# Patient Record
Sex: Female | Born: 1980 | Race: White | Hispanic: No | Marital: Married | Smoking: Never smoker
Health system: Southern US, Community
[De-identification: ages and names within clinical notes are randomized; demographics above are authoritative.]

## PROBLEM LIST (undated history)

## (undated) DIAGNOSIS — J45909 Unspecified asthma, uncomplicated: Secondary | ICD-10-CM

## (undated) HISTORY — PX: NO PAST SURGERIES: SHX2092

---

## 2015-08-05 ENCOUNTER — Other Ambulatory Visit (HOSPITAL_COMMUNITY): Payer: Self-pay | Admitting: Obstetrics and Gynecology

## 2015-08-05 DIAGNOSIS — Z3689 Encounter for other specified antenatal screening: Secondary | ICD-10-CM

## 2015-08-05 DIAGNOSIS — Z3A18 18 weeks gestation of pregnancy: Secondary | ICD-10-CM

## 2015-08-05 DIAGNOSIS — O289 Unspecified abnormal findings on antenatal screening of mother: Secondary | ICD-10-CM

## 2015-08-30 ENCOUNTER — Encounter (HOSPITAL_COMMUNITY): Payer: Self-pay | Admitting: Obstetrics and Gynecology

## 2015-09-06 ENCOUNTER — Ambulatory Visit (HOSPITAL_COMMUNITY)
Admission: RE | Admit: 2015-09-06 | Discharge: 2015-09-06 | Disposition: A | Discharge status: Home / Self Care | Payer: BLUE CROSS/BLUE SHIELD | Source: Ambulatory Visit | Attending: Obstetrics and Gynecology | Admitting: Obstetrics and Gynecology

## 2015-09-06 ENCOUNTER — Encounter (HOSPITAL_COMMUNITY): Payer: Self-pay

## 2015-09-06 DIAGNOSIS — O283 Abnormal ultrasonic finding on antenatal screening of mother: Secondary | ICD-10-CM | POA: Diagnosis not present

## 2015-09-06 DIAGNOSIS — O09899 Supervision of other high risk pregnancies, unspecified trimester: Secondary | ICD-10-CM

## 2015-09-06 DIAGNOSIS — Z3689 Encounter for other specified antenatal screening: Secondary | ICD-10-CM

## 2015-09-06 DIAGNOSIS — O289 Unspecified abnormal findings on antenatal screening of mother: Secondary | ICD-10-CM

## 2015-09-06 DIAGNOSIS — Z3A18 18 weeks gestation of pregnancy: Secondary | ICD-10-CM | POA: Insufficient documentation

## 2015-09-06 DIAGNOSIS — Z315 Encounter for genetic counseling: Secondary | ICD-10-CM | POA: Insufficient documentation

## 2015-09-06 DIAGNOSIS — O280 Abnormal hematological finding on antenatal screening of mother: Secondary | ICD-10-CM

## 2015-09-06 HISTORY — DX: Unspecified asthma, uncomplicated: J45.909

## 2015-09-06 NOTE — Progress Notes (Signed)
Genetic Counseling  High-Risk Gestation Note  Appointment Date:  09/06/2015 Referred By: Marylen Ponto, DO Date of Birth:  22-Mar-1980 Partner:  Carol Carroll   Pregnancy History: [id number] Estimated Date of Delivery: 02/07/16 Estimated Gestational Age: [redacted]w[redacted]d Attending: Alpha Gula, MD   Mrs. Carol Carroll and her husband, Carol Carroll, were seen for genetic counseling because of an increased risk for fetal Down syndrome based on First trimester screening.  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  We reviewed chromosomes, nondisjunction, and the common features and variable prognosis of Down syndrome.  In addition, we reviewed the screen adjusted reduction in risks for trisomy 18/13 (1 in 547 to 1 in 1131) and open neural tube defects.  We also discussed other explanations for a screen positive result including: differences in maternal metabolism, and normal variation. We specifically discussed that the level of one of the proteins analyzed on the screen, PAPP-A, was very low (0.17 MoM).  This has been associated with an increased risk for growth restriction or poor pregnancy outcome later in pregnancy; therefore, we would recommend a follow up ultrasound for fetal growth in the third trimester.  We reviewed other available screening options including noninvasive prenatal screening (NIPS)/cell free DNA (cfDNA) screening, and detailed ultrasound.  They were counseled that  screening tests are used to modify a patient's a priori risk for aneuploidy, typically based on age. This estimate provides a pregnancy specific risk assessment. We reviewed the benefits and limitations of each option. Specifically, we discussed the conditions for which each test screens, the detection rates, and false positive rates of each. They were also counseled regarding diagnostic testing via amniocentesis. We reviewed the approximate 1 in 300-500 risk for complications from amniocentesis, including spontaneous pregnancy loss. We discussed the possible results that the tests might provide including: positive, negative, unanticipated, and no result. Finally, they were counseled regarding the cost of each option and potential out of pocket expenses.  Detailed ultrasound was performed today. Visualized fetal anatomy was within normal range. Complete ultrasound results reported separately. After consideration of all the options, they declined NIPS and amniocentesis. They understand that screening tests cannot rule out all birth defects or genetic syndromes. The patient was advised of this limitation and states she still does not want additional testing at this time.   Mrs. Carol Carroll was provided with written information regarding cystic fibrosis (CF), spinal muscular atrophy (SMA) and hemoglobinopathies including the carrier frequency, availability of carrier screening and prenatal diagnosis if indicated.  In addition, we discussed that CF and hemoglobinopathies are routinely screened for as part of the [place] newborn screening panel.  After further discussion, she elected to pursue / declined screening for CF, SMA and hemoglobinopathies.   Both family histories were reviewed and found to be contributory for cleft lip and palate for Mr. Feigenbaum paternal grandfather. We discussed that cleft lip +/- cleft palate can be syndromic or isolated.  If the patient's relative has a syndromic form of clefting, the chance  of having an affected child depends on the inheritance pattern of  that condition.  If the patient's relative has an isolated form of clefting, we discussed the probable multifactorial inheritance and explained that genetic testing for isolated cleft lip +/- cleft palate is not currently available.  Based on the family history, this couple's chance to have a baby with an isolated cleft lip +/- cleft palate is approximately 0.3%. Without further information regarding the provided family history, an accurate genetic risk cannot be calculated. Further genetic counseling is warranted if more information is obtained.  Mrs. Carol Carroll denied exposure to environmental toxins or chemical agents. She denied the use of alcohol, tobacco or street drugs. She denied significant viral illnesses during the course of her pregnancy. Her medical and surgical histories were noncontributory.   I counseled this couple for approximately 40 minutes regarding the above risks and available options.   Quinn Plowman, MS,  Certified Genetic Counselor 09/06/2015

## 2015-09-08 ENCOUNTER — Encounter (HOSPITAL_COMMUNITY): Payer: Self-pay

## 2015-09-08 ENCOUNTER — Other Ambulatory Visit (HOSPITAL_COMMUNITY): Payer: Self-pay

## 2015-09-30 ENCOUNTER — Ambulatory Visit (HOSPITAL_COMMUNITY)
Admission: RE | Admit: 2015-09-30 | Discharge: 2015-09-30 | Disposition: A | Discharge status: Home / Self Care | Payer: BLUE CROSS/BLUE SHIELD | Source: Ambulatory Visit | Attending: Obstetrics and Gynecology | Admitting: Obstetrics and Gynecology

## 2015-09-30 ENCOUNTER — Encounter (HOSPITAL_COMMUNITY): Payer: Self-pay

## 2015-09-30 DIAGNOSIS — O280 Abnormal hematological finding on antenatal screening of mother: Secondary | ICD-10-CM

## 2015-09-30 DIAGNOSIS — Z3A21 21 weeks gestation of pregnancy: Secondary | ICD-10-CM | POA: Diagnosis not present

## 2015-09-30 DIAGNOSIS — O283 Abnormal ultrasonic finding on antenatal screening of mother: Secondary | ICD-10-CM | POA: Insufficient documentation

## 2015-09-30 DIAGNOSIS — Z3689 Encounter for other specified antenatal screening: Secondary | ICD-10-CM

## 2015-09-30 DIAGNOSIS — O09899 Supervision of other high risk pregnancies, unspecified trimester: Secondary | ICD-10-CM

## 2015-10-03 ENCOUNTER — Other Ambulatory Visit (HOSPITAL_COMMUNITY): Payer: Self-pay | Admitting: *Deleted

## 2015-10-03 DIAGNOSIS — O280 Abnormal hematological finding on antenatal screening of mother: Principal | ICD-10-CM

## 2015-10-03 DIAGNOSIS — O09899 Supervision of other high risk pregnancies, unspecified trimester: Secondary | ICD-10-CM

## 2015-10-04 ENCOUNTER — Ambulatory Visit (HOSPITAL_COMMUNITY): Payer: BLUE CROSS/BLUE SHIELD

## 2015-11-17 ENCOUNTER — Ambulatory Visit (HOSPITAL_COMMUNITY)
Admission: RE | Admit: 2015-11-17 | Discharge: 2015-11-17 | Disposition: A | Discharge status: Home / Self Care | Payer: BLUE CROSS/BLUE SHIELD | Source: Ambulatory Visit | Attending: Obstetrics and Gynecology | Admitting: Obstetrics and Gynecology

## 2015-11-17 ENCOUNTER — Other Ambulatory Visit (HOSPITAL_COMMUNITY): Payer: Self-pay | Admitting: Maternal and Fetal Medicine

## 2015-11-17 ENCOUNTER — Encounter (HOSPITAL_COMMUNITY): Payer: Self-pay

## 2015-11-17 DIAGNOSIS — O09899 Supervision of other high risk pregnancies, unspecified trimester: Secondary | ICD-10-CM

## 2015-11-17 DIAGNOSIS — O280 Abnormal hematological finding on antenatal screening of mother: Principal | ICD-10-CM

## 2015-11-17 DIAGNOSIS — Z3A28 28 weeks gestation of pregnancy: Secondary | ICD-10-CM

## 2015-11-17 DIAGNOSIS — O283 Abnormal ultrasonic finding on antenatal screening of mother: Secondary | ICD-10-CM | POA: Diagnosis present

## 2015-11-17 DIAGNOSIS — Z3A21 21 weeks gestation of pregnancy: Secondary | ICD-10-CM

## 2016-07-11 ENCOUNTER — Encounter (HOSPITAL_COMMUNITY): Payer: Self-pay

## 2017-01-16 IMAGING — US US MFM OB FOLLOW-UP
1 series · 14 of 28 positions shown · non-contrast
Comparison: none

[Series 1: us mfm ob follow-up · 61 acquisitions, 14 frames shown]
[im 3/61]
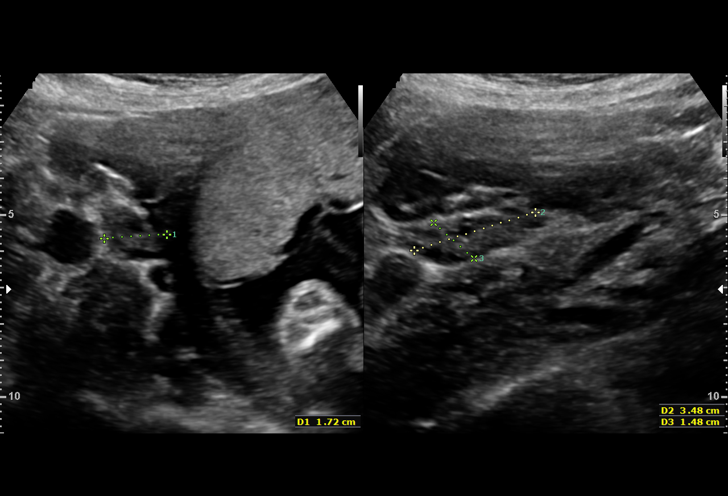
[im 7/61]
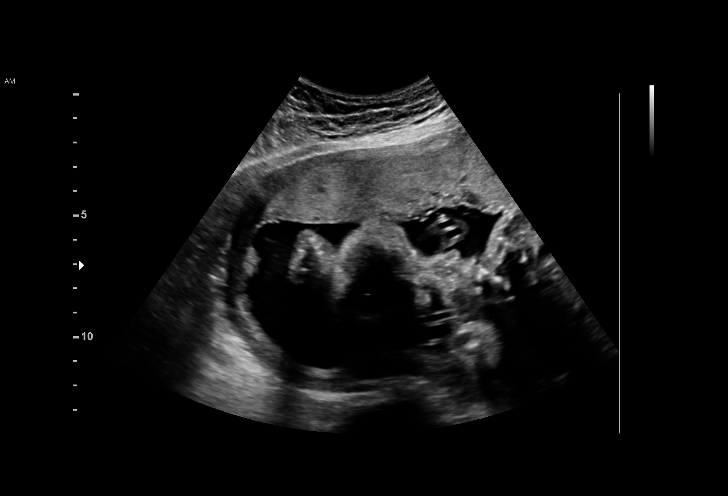
[im 12/61]
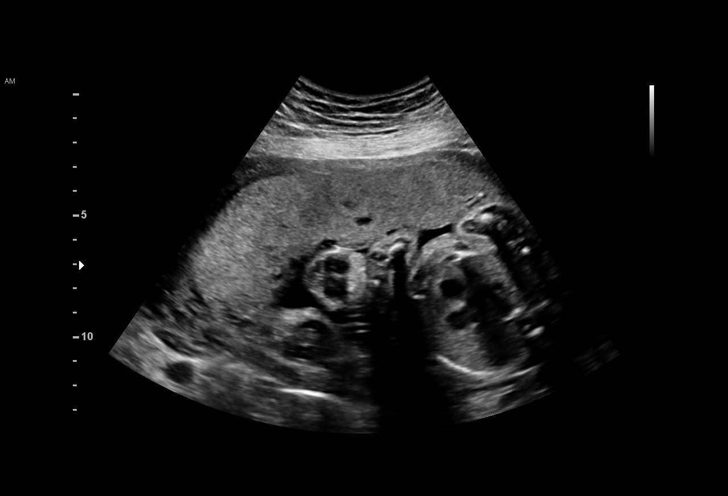
[im 16/61]
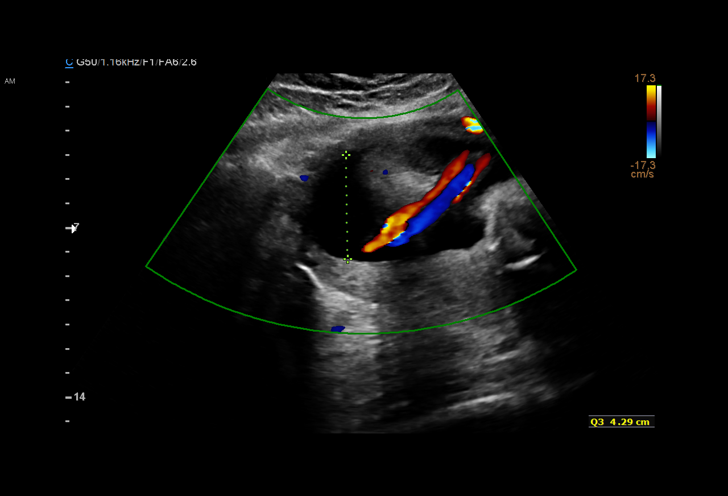
[im 21/61]
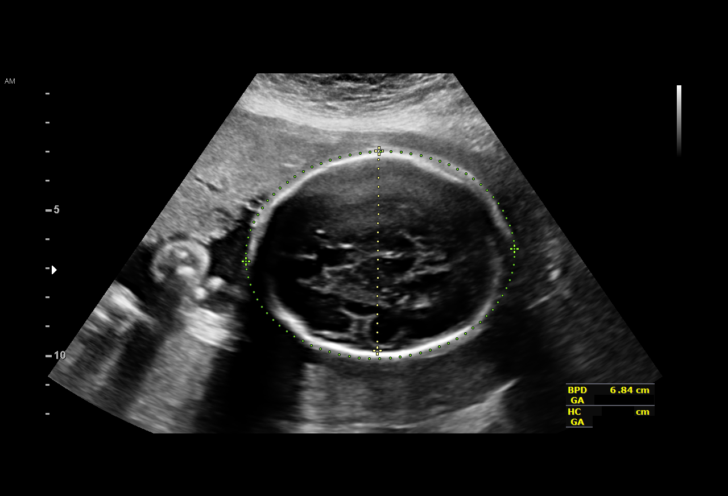
[im 25/61]
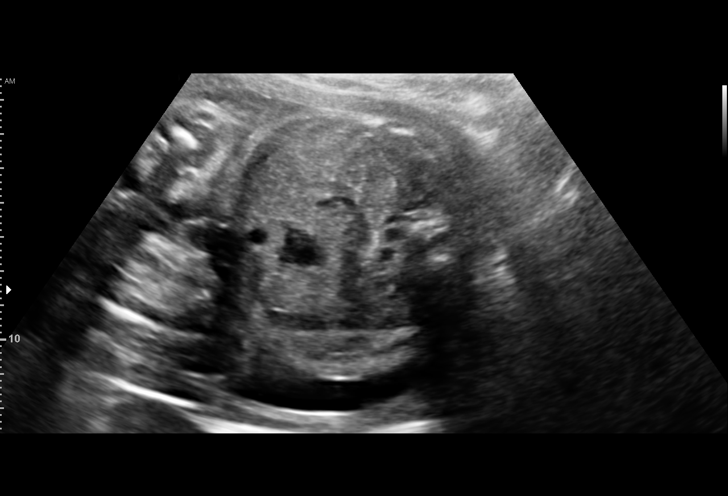
[im 29/61]
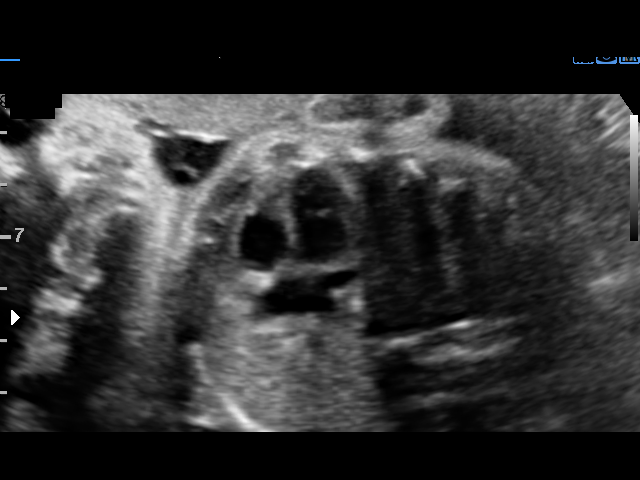
[im 34/61]
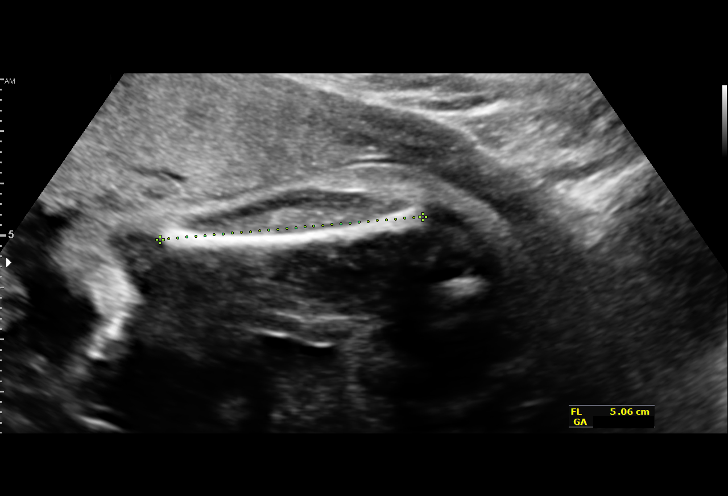
[im 38/61]
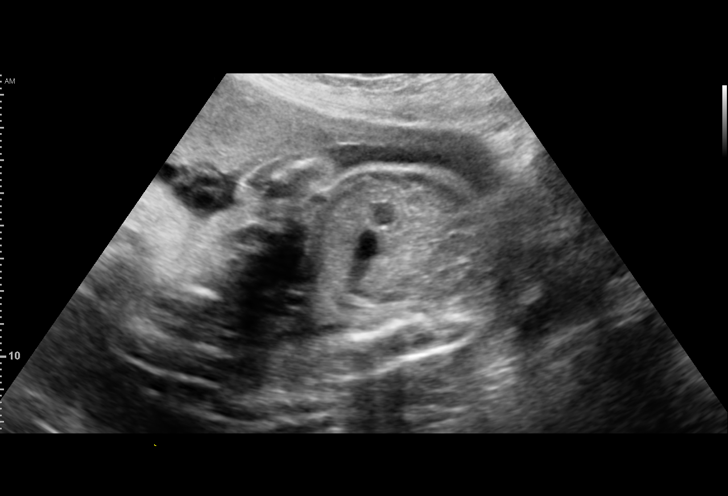
[im 43/61]
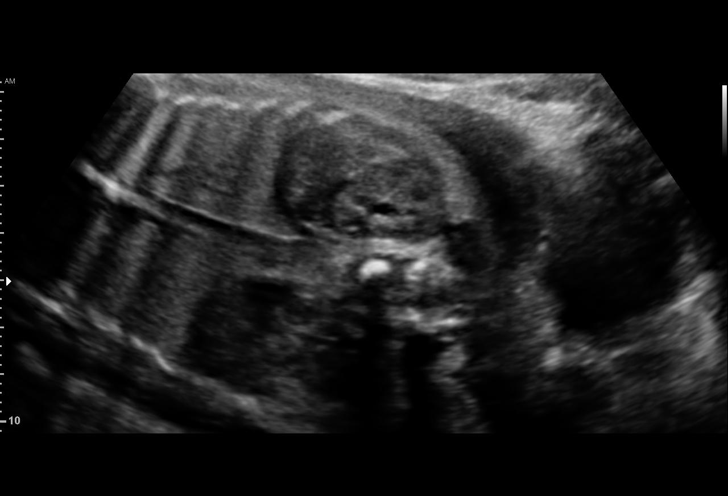
[im 47/61]
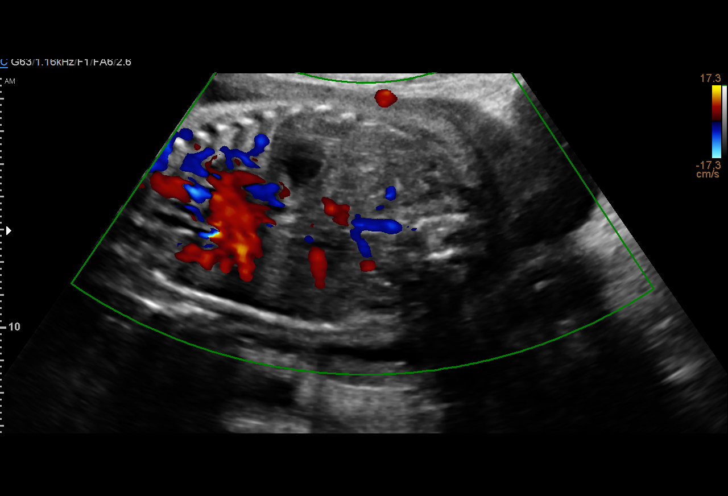
[im 52/61]
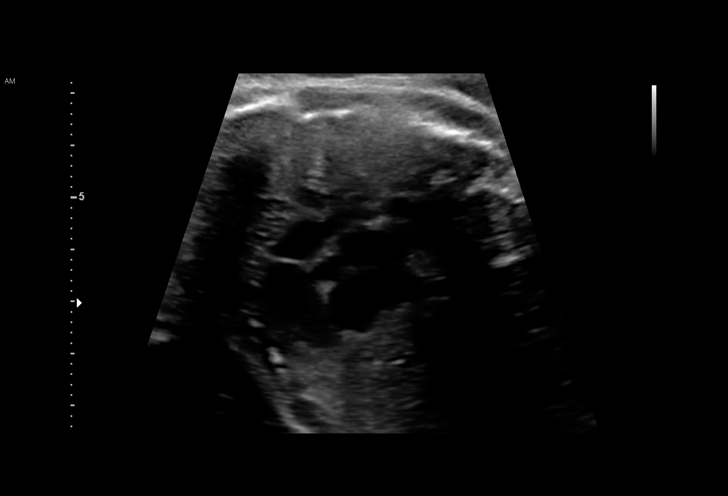
[im 56/61]
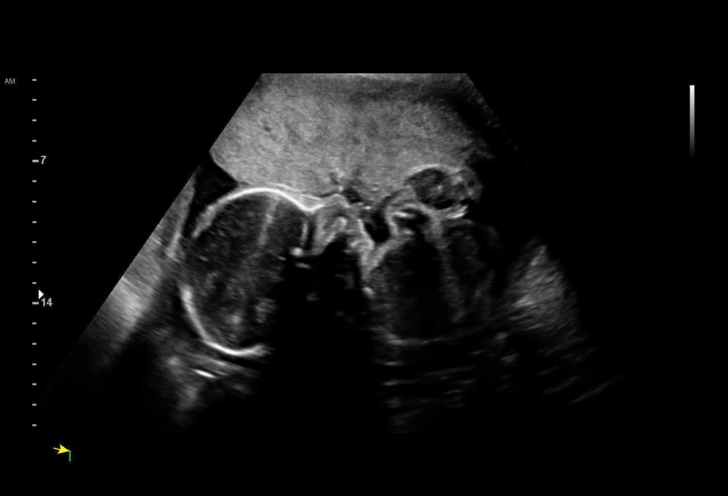
[im 61/61]
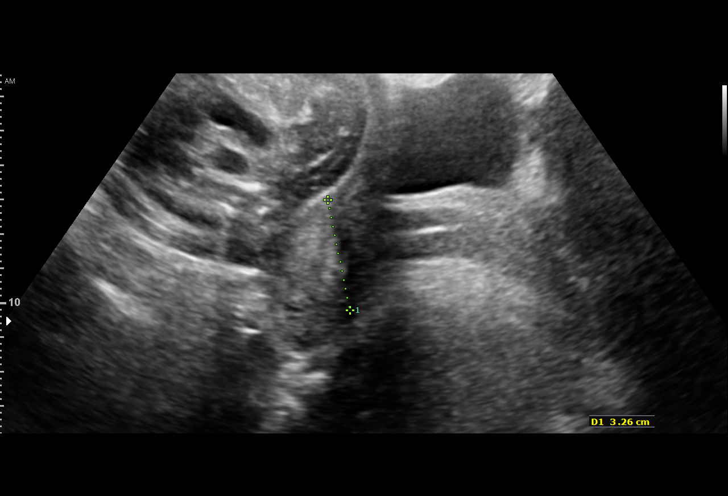

[14 of 28 positions shown; findings below may reference images not displayed]

RAMESH DO

1  JUNGSUP BARNINGER            [id number]      [id number]     [id number]
Indications

28 weeks gestation of pregnancy
Abnormal first trimester screen ([address])
OB History

Gravidity:    2         Term:   1
Living:       1
Fetal Evaluation

Num Of Fetuses:     1
Fetal Heart         134
Rate(bpm):
Cardiac Activity:   Observed
Presentation:       Breech
Placenta:           Anterior, above cervical os
P. Cord Insertion:  Visualized, central

Amniotic Fluid
AFI FV:      Subjectively within normal limits

AFI Sum(cm)     %Tile       Largest Pocket(cm)
11.76           26

RUQ(cm)       RLQ(cm)       LUQ(cm)        LLQ(cm)
4.97
Biometry

BPD:      68.7  mm     G. Age:  27w 4d         19  %    CI:        73.05   %   70 - 86
FL/HC:      19.8   %   18.8 -
HC:      255.5  mm     G. Age:  27w 5d          9  %    HC/AC:      1.03       1.05 -
AC:      247.5  mm     G. Age:  29w 0d         64  %    FL/BPD:     73.7   %   71 - 87
FL:       50.6  mm     G. Age:  27w 1d         10  %    FL/AC:      20.4   %   20 - 24
Est. FW:    9968  gm    2 lb 10 oz      48  %
Gestational Age

LMP:           28w 2d       Date:   05/03/15                 EDD:   02/07/16
U/S Today:     27w 6d                                        EDD:   02/10/16
Best:          28w 2d    Det. By:   LMP  (05/03/15)          EDD:   02/07/16
Anatomy

Cranium:               Appears normal         Aortic Arch:            Appears normal
Cavum:                 Appears normal         Ductal Arch:            Previously seen
Ventricles:            Appears normal         Diaphragm:              Appears normal
Choroid Plexus:        Previously seen        Stomach:                Appears normal, left
sided
Cerebellum:            Previously seen        Abdomen:                Appears normal
Posterior Fossa:       Previously seen        Abdominal Wall:         Previously seen
Nuchal Fold:           Previously seen        Cord Vessels:           Appears normal (3
vessel cord)
Face:                  Orbits and profile     Kidneys:                Appear normal
previously seen
Lips:                  Appears normal         Bladder:                Appears normal
Thoracic:              Appears normal         Spine:                  Previously seen
Heart:                 Appears normal         Upper Extremities:      Previously seen
(4CH, axis, and situs
RVOT:                  Appears normal         Lower Extremities:      Previously seen
LVOT:                  Appears normal

Other:  Fetus appears to be a female. Heels previously visualized.
Cervix Uterus Adnexa

Cervix
Length:            3.3  cm.
Normal appearance by transabdominal scan.

Uterus
No abnormality visualized.

Left Ovary
Within normal limits.

Right Ovary
Within normal limits.

Adnexa:       No abnormality visualized.
Impression

SIUP at 28+2 weeks
Normal interval anatomy; anatomic survey complete
Normal amniotic fluid volume
Appropriate interval growth with EFW at the 48th %tile
Recommendations

Follow-up ultrasound for growth in 4-6 weeks

We would be pleased to perform this exam.  If desired,
please call to schedule.
